# Patient Record
Sex: Female | Born: 1975 | Race: White | Hispanic: No | Marital: Single | Smoking: Never smoker
Health system: Southern US, Community
[De-identification: ages and names within clinical notes are randomized; demographics above are authoritative.]

## PROBLEM LIST (undated history)

## (undated) HISTORY — PX: APPENDECTOMY: SHX54

## (undated) HISTORY — PX: TONSILLECTOMY: SUR1361

---

## 2020-11-06 ENCOUNTER — Other Ambulatory Visit: Payer: Self-pay

## 2020-11-06 ENCOUNTER — Ambulatory Visit
Admission: RE | Admit: 2020-11-06 | Discharge: 2020-11-06 | Disposition: A | Discharge status: Home / Self Care | Payer: BC Managed Care – PPO | Source: Ambulatory Visit | Attending: Family Medicine | Admitting: Family Medicine

## 2020-11-06 ENCOUNTER — Ambulatory Visit (INDEPENDENT_AMBULATORY_CARE_PROVIDER_SITE_OTHER): Payer: BC Managed Care – PPO

## 2020-11-06 VITALS — BP 141/86 | HR 85 | Temp 98.1°F | Resp 18

## 2020-11-06 DIAGNOSIS — S60221A Contusion of right hand, initial encounter: Secondary | ICD-10-CM | POA: Diagnosis not present

## 2020-11-06 DIAGNOSIS — M79641 Pain in right hand: Secondary | ICD-10-CM

## 2020-11-06 DIAGNOSIS — S8001XA Contusion of right knee, initial encounter: Secondary | ICD-10-CM

## 2020-11-06 DIAGNOSIS — S80211A Abrasion, right knee, initial encounter: Secondary | ICD-10-CM | POA: Diagnosis not present

## 2020-11-06 DIAGNOSIS — W19XXXA Unspecified fall, initial encounter: Secondary | ICD-10-CM

## 2020-11-06 NOTE — Discharge Instructions (Addendum)
Rest, ice, elevation.  OTC Ibuprofen as needed.  Keep the wound clean.  Take care  Dr. Adriana Simas
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
## 2022-10-11 IMAGING — CR DG HAND COMPLETE 3+V*R*
3 series · 3 of 3 positions shown · non-contrast
Comparison: None.

CLINICAL DATA: Status post fall. Pain in second and third
metacarpal phalangeal joints.

EXAM:
RIGHT HAND - COMPLETE 3+ VIEW

[hand ap]
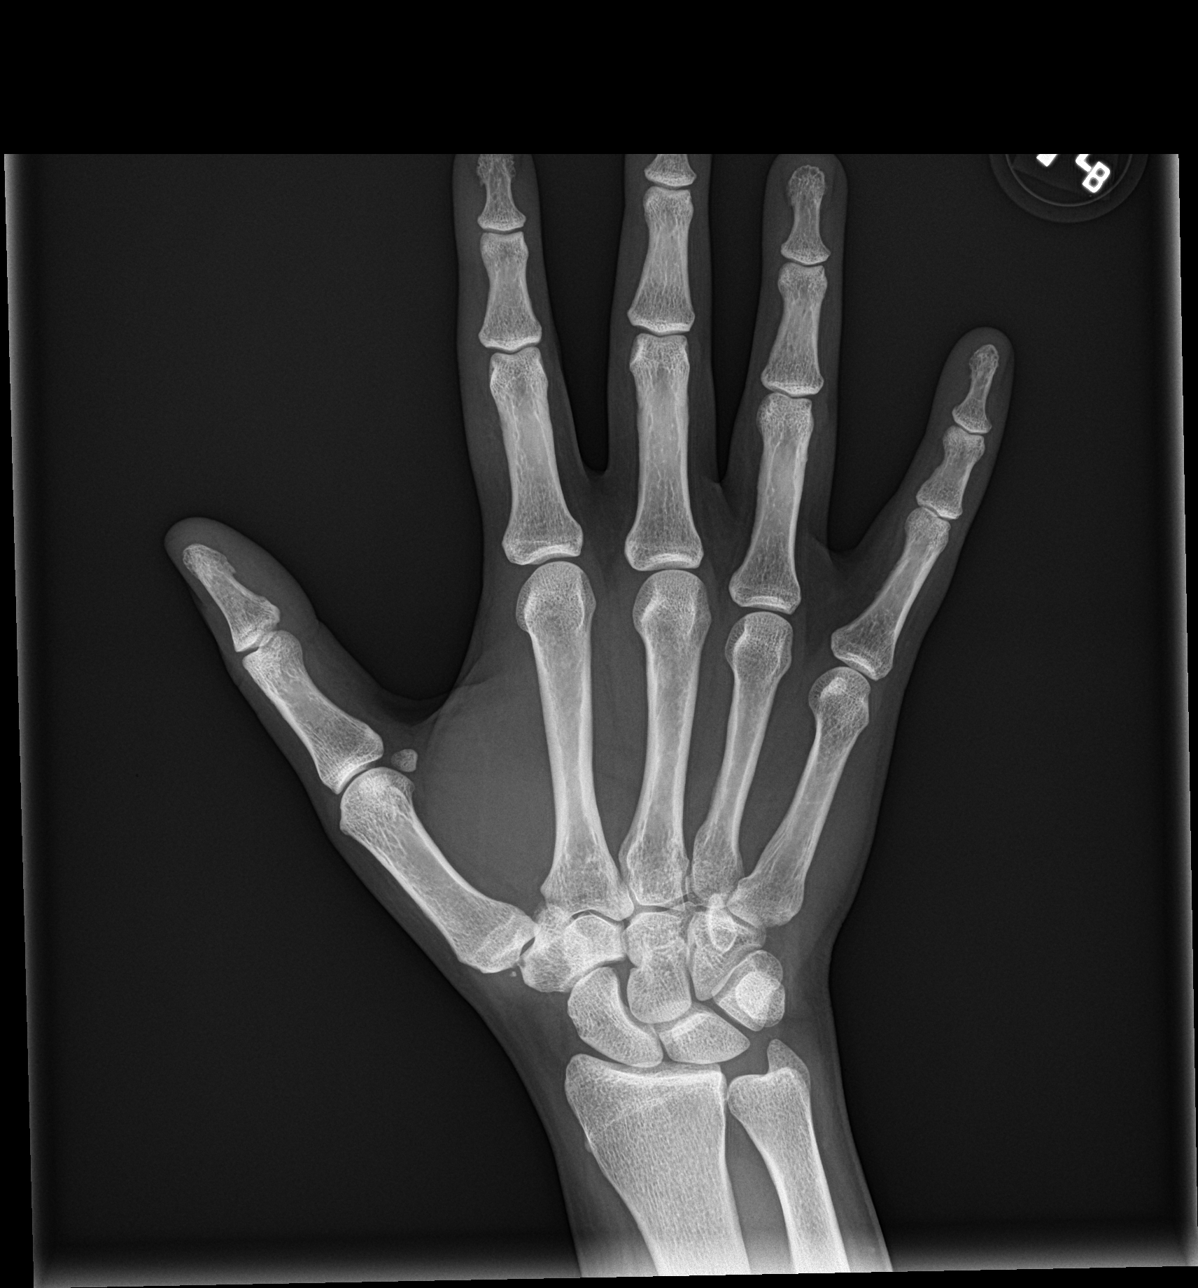

[hand obl]
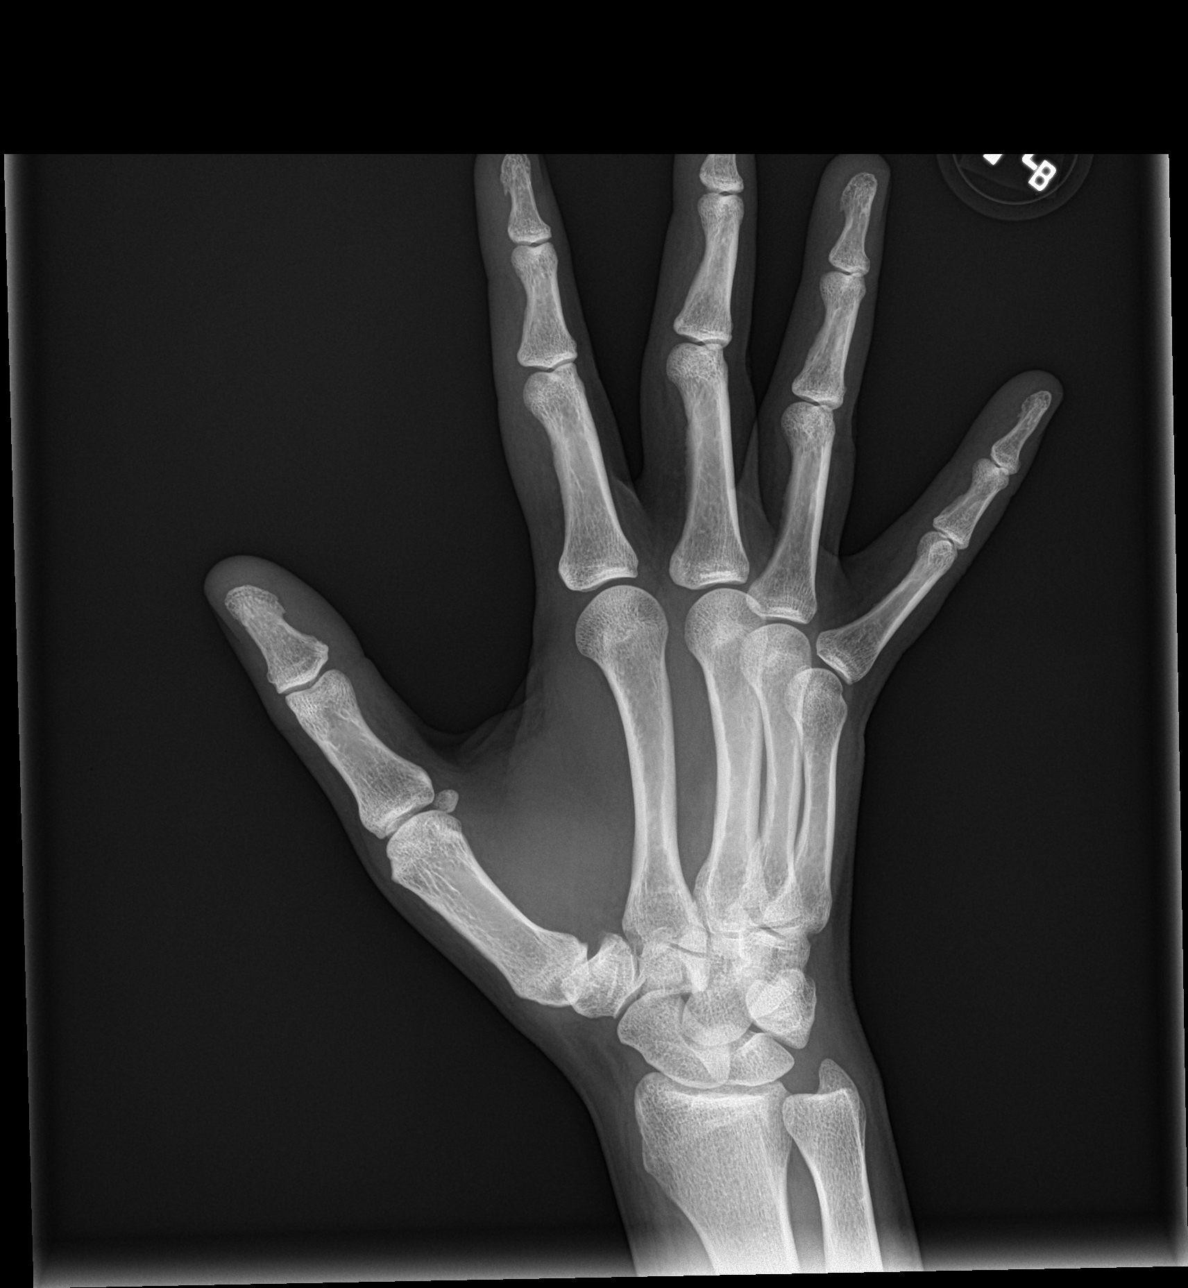

[hand lat]
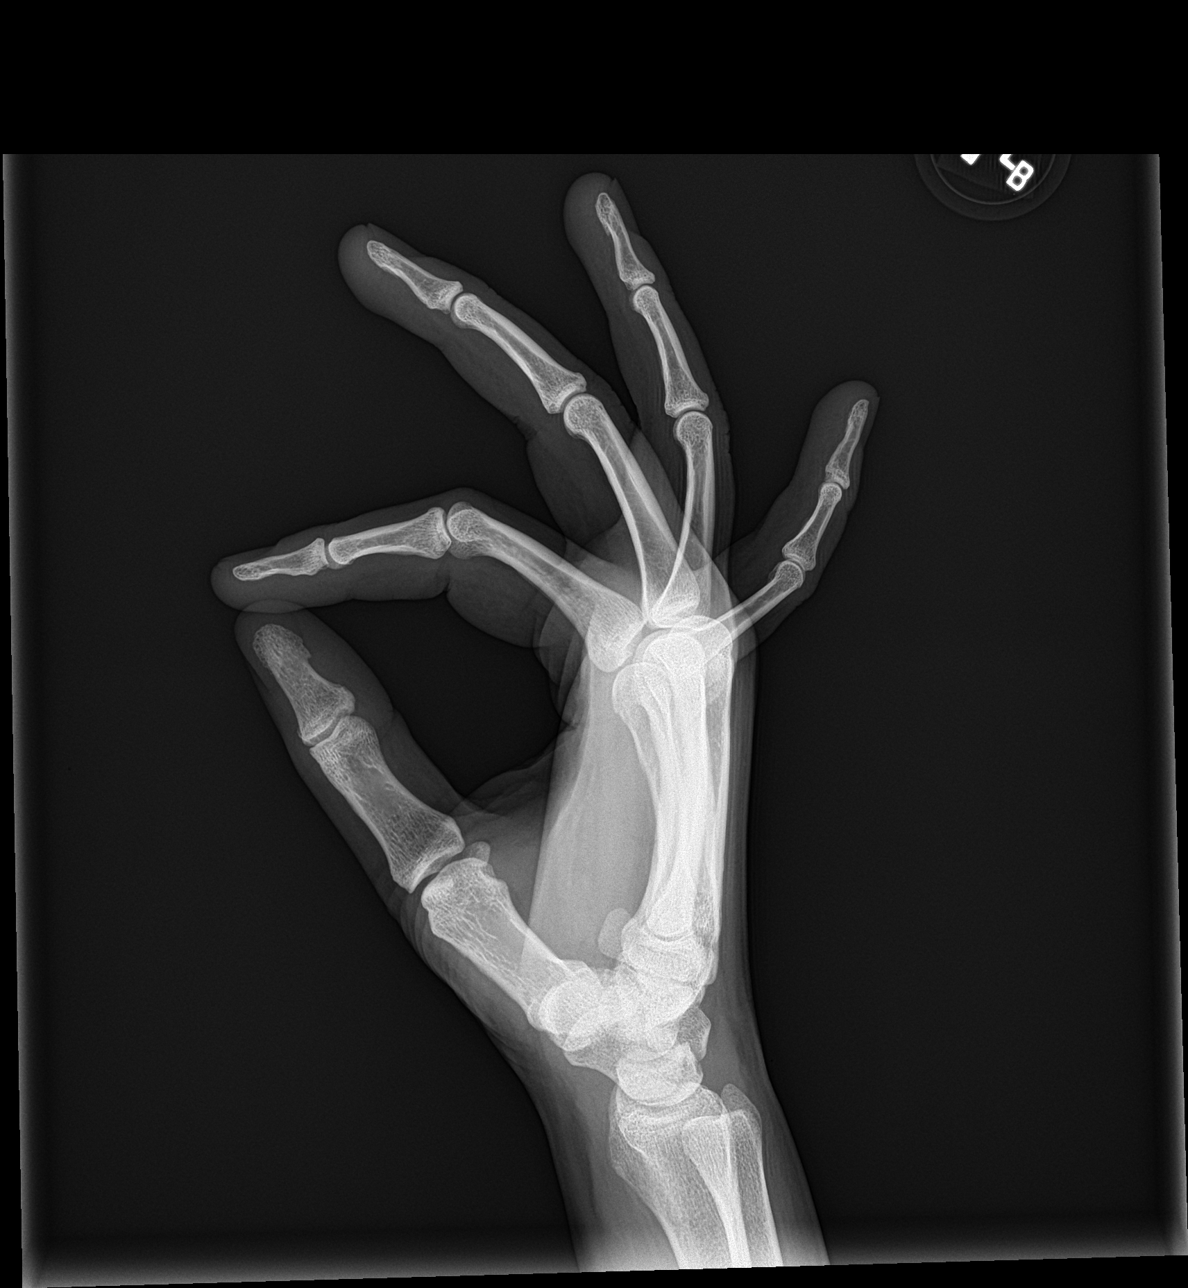

[3 of 3 positions shown; findings below may reference images not displayed]

FINDINGS: There is no evidence of fracture or dislocation. There is no
evidence of arthropathy or other focal bone abnormality. Soft
tissues are unremarkable.
IMPRESSION: Negative.
